# Patient Record
Sex: Male | Born: 1995 | Race: White | Hispanic: No | Marital: Single | Smoking: Never smoker
Health system: Southern US, Community
[De-identification: ages and names within clinical notes are randomized; demographics above are authoritative.]

---
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
## 2017-10-23 DIAGNOSIS — S93401A Sprain of unspecified ligament of right ankle, initial encounter: Secondary | ICD-10-CM | POA: Diagnosis not present

## 2017-11-04 DIAGNOSIS — H5213 Myopia, bilateral: Secondary | ICD-10-CM | POA: Diagnosis not present

## 2018-01-30 ENCOUNTER — Encounter: Payer: Self-pay | Admitting: Family Medicine

## 2018-01-30 ENCOUNTER — Ambulatory Visit (INDEPENDENT_AMBULATORY_CARE_PROVIDER_SITE_OTHER): Payer: 59 | Admitting: Family Medicine

## 2018-01-30 VITALS — BP 109/54 | HR 69 | Temp 98.5°F | Resp 16 | Ht 68.75 in | Wt 155.8 lb

## 2018-01-30 DIAGNOSIS — Z7689 Persons encountering health services in other specified circumstances: Secondary | ICD-10-CM | POA: Diagnosis not present

## 2018-01-30 DIAGNOSIS — J3089 Other allergic rhinitis: Secondary | ICD-10-CM | POA: Diagnosis not present

## 2018-01-30 DIAGNOSIS — Z23 Encounter for immunization: Secondary | ICD-10-CM | POA: Diagnosis not present

## 2018-01-30 NOTE — Progress Notes (Signed)
Subjective:    Patient ID: Benjamin Roberts, male    DOB: 04/17/1996, 22 y.o.   MRN: [id number]  Benjamin Roberts is a 22 y.o. male presenting on 01/30/2018 for Establish Care and Allergies (seasonal, environmental)  History primarily provided patient and also his mother, Gunnar Fusi, in attendance at visit today. He is establishing care here with new PCP. Previously followed by [facility].  HPI   Establish Care / General Health - Reports no significant long term health problems. 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 He does not use nose sprays or other treatment.   Confidentiality was discussed with the patient and if applicable, with caregiver as well.  Home: No concerns. Education: Doing well, at [employer] Activities: [employer]. He is physically active with training exercises. Drugs/Alcohol: Denies using. Sex: Admits sexually active. He uses condoms. Stress / Mood: Denies any concerns at this time.   Health Maintenance:  Vaccines - reviewed [facility] immunization record. All vaccines due and recommended were discussed with both patient and his father today.   He is due for the following vaccines at this time:   Men-B: He is due for this vaccine - patient and parent interested in Men-B (Bexsero) vaccination, we have discussed the benefits of vaccines, risks of disease  process it is aiming to prevent, and potential risks of vaccination. Due today for 1st dose - will receive. Will return for 2nd dose of series in 2 months.  UTD Tdap - from 07/24/2009 - next due after 10 years from last - due in 07/2019 - deferred today  Depression screen PHQ 2/9 01/30/2018  Decreased Interest 0  Down, Depressed, Hopeless 0  PHQ - 2 Score 0    History reviewed. No pertinent past medical history. Past Surgical History:  Procedure Laterality Date  . DENTAL SURGERY  2013   Wisdom teeth extraction, canine surgery (retained teeth)   Social History   Socioeconomic History  . Marital status: Single    Spouse name: Not on file  . Number of children: Not on file  . Years of education: Not on file  . Highest education level: Not on file  Occupational History  . Not on file  Social Needs  . Financial resource strain: Not on file  . Food insecurity:    Worry: Not on file    Inability: Not on file  . Transportation needs:    Medical: Not on file    Non-medical: Not on file  Tobacco Use  . Smoking status: Never Smoker  . Smokeless tobacco: Never Used  Substance and Sexual Activity  . Alcohol use: Never    Frequency: Never  . Drug use: Never  . Sexual activity: Not on file  Lifestyle  . Physical activity:    Days per week: Not on file    Minutes per session: Not on file  .  Stress: Not on file  Relationships  . Social connections:    Talks on phone: Not on file    Gets together: Not on file    Attends religious service: Not on file    Active member of club or organization: Not on file    Attends meetings of clubs or organizations: Not on file    Relationship status: Not on file  . Intimate partner violence:    Fear of current or ex partner: Not on file    Emotionally abused: Not on file    Physically abused: Not on file    Forced sexual activity: Not on file  Other Topics Concern  . Not on file  Social History Narrative  . Not on file   Family History    Problem Relation Age of Onset  . Cancer Mother        skin  . OCD Mother   . Insomnia Mother   . Diabetes Father   . Hypertension Father   . Hypertension Maternal Grandmother   . Hyperlipidemia Maternal Grandmother   . Hypertension Maternal Grandfather   . Depression Maternal Grandfather   . Cancer Paternal Grandfather        colon, bladder  . Hypertension Paternal Grandfather   . Hyperlipidemia Paternal Grandfather   . Heart disease Paternal Grandfather   . Food Allergy Other   . Other Other        POTS   No current outpatient medications on file prior to visit.   No current facility-administered medications on file prior to visit.     Review of Systems Per HPI unless specifically indicated above     Objective:    BP (!) 109/54   Pulse 69   Temp 98.5 F (36.9 C) (Oral)   Resp 16   Ht 5' 8.75" (1.746 m)   Wt 155 lb 12.8 oz (70.7 kg)   BMI 23.18 kg/m   Wt Readings from Last 3 Encounters:  01/30/18 155 lb 12.8 oz (70.7 kg) (51 %, Z= 0.03)*   * Growth percentiles are based on CDC (Boys, 2-20 Years) data.    Physical Exam  Constitutional: He is oriented to person, place, and time. He appears well-developed and well-nourished. No distress.  Well-appearing, comfortable, cooperative, thin athletic  HENT:  Head: Normocephalic and atraumatic.  Mouth/Throat: Oropharynx is clear and moist.  Nares patent without purulence or edema. Bilateral TMs clear without erythema, effusion or bulging, minimal soft cerumen not impacted. Oropharynx clear without erythema, exudates, edema or asymmetry.  Eyes: Conjunctivae are normal. Right eye exhibits no discharge. Left eye exhibits no discharge.  Neck: Normal range of motion. Neck supple. No thyromegaly present.  Cardiovascular: Normal rate, regular rhythm, normal heart sounds and intact distal pulses.  No murmur heard. Pulmonary/Chest: Effort normal and breath sounds normal. No respiratory distress. He has no wheezes. He has no  rales.  Musculoskeletal: Normal range of motion. He exhibits no edema.  Back normal without deformity or abnormal curvature.  Upper / Lower Extremities: - Normal muscle tone, strength bilateral upper extremities 5/5, lower extremities 5/5 - Bilateral shoulders, knees, wrist, ankles without deformity, tenderness, effusion - Normal Gait  Lymphadenopathy:    He has no cervical adenopathy.  Neurological: He is alert and oriented to person, place, and time.  Skin: Skin is warm and dry. No rash noted. He is not diaphoretic. No erythema.  Psychiatric: He has a normal mood and affect. His behavior is normal.  Well groomed, good eye contact,  normal speech and thoughts  Nursing note and vitals reviewed.  No results found for this or any previous visit.    Assessment & Plan:   Problem List Items Addressed This Visit    Environmental and seasonal allergies - Primary Stable without flare Not taking med currently Worse seasons Spring/Summer/Fall Advised to take 2nd gen anti-histamine prior to onset allergy season preventatively May use Nasal Saline preventatively as well - if avoid meds Follow-up     Other Visit Diagnoses    Encounter to establish care with new doctor     [facility]    Need for meningococcal vaccination     See about HPI regarding vaccines - Counseling provided to patient and parent - Avg risk patient - Highly recommended vaccine, already had meningococcal vaccine ACY  1st dose Men-B today - return 2 months for 2nd dose and Flu vaccine    Relevant Orders   Meningococcal B, OMV (Bexsero) (Completed)      No orders of the defined types were placed in this encounter.   Follow up plan: Return in about 2 months (around 04/01/2018) for 2nd dose men-B vaccine and Flu shot - then yearly check-up.  Saralyn PilarAlexander Tashema Tiller, DO [facility] 01/30/2018, 9:52 PM

## 2018-01-30 NOTE — Patient Instructions (Addendum)
Thank you for coming to the office today.  Overall I think you are in good health. Keep up the good work.  Stay active as you are to keep leading healthy lifestyle.  For allergies - I do recommend trying to use claritin or zyrtec about 1 month BEFORE seasonal allergies start to help reduce symptoms.  - Recommend to start using Nasal Saline (Simply Saline - OTC) spray multiple times a day to help flush out congestion and clear sinuses - this can reduce allergen exposure  You are due for Meningococcal B Vaccine (alternate strain, rare but very serious type of meningitis) - Bexsero vaccine - one dose today - 2nd final dose in 2 months  May get Flu Shot when you return at that time as well.  You are not quite due for TDap tetnaus vaccine at this time, - last was 07/2009 - next due in 07/2019  Follow-up as needed - keep doing yearly physical through [employer] and I would recommend a yearly check up here to make sure doing well.  Please schedule a Follow-up Appointment to: Return in about 2 months (around 04/01/2018) for 2nd dose men-B vaccine and Flu shot - then yearly check-up.  If you have any other questions or concerns, please feel free to call the office or send a message through MyChart. You may also schedule an earlier appointment if necessary.  Additionally, you may be receiving a survey about your experience at our office within a few days to 1 week by e-mail or mail. We value your feedback.  Saralyn PilarAlexander Latriece Anstine, DO [facility]

## 2018-02-18 ENCOUNTER — Ambulatory Visit
Admission: EM | Admit: 2018-02-18 | Discharge: 2018-02-18 | Disposition: A | Discharge status: Home / Self Care | Payer: 59 | Attending: Family Medicine | Admitting: Family Medicine

## 2018-02-18 ENCOUNTER — Other Ambulatory Visit: Payer: Self-pay

## 2018-02-18 ENCOUNTER — Ambulatory Visit (INDEPENDENT_AMBULATORY_CARE_PROVIDER_SITE_OTHER): Payer: 59

## 2018-02-18 ENCOUNTER — Encounter: Payer: Self-pay | Admitting: Gynecology

## 2018-02-18 DIAGNOSIS — S90211A Contusion of right great toe with damage to nail, initial encounter: Secondary | ICD-10-CM | POA: Diagnosis not present

## 2018-02-18 DIAGNOSIS — S99921A Unspecified injury of right foot, initial encounter: Secondary | ICD-10-CM

## 2018-02-18 DIAGNOSIS — S90221A Contusion of right lesser toe(s) with damage to nail, initial encounter: Secondary | ICD-10-CM

## 2018-02-18 DIAGNOSIS — Z23 Encounter for immunization: Secondary | ICD-10-CM

## 2018-02-18 MED ORDER — TETANUS-DIPHTH-ACELL PERTUSSIS 5-2.5-18.5 LF-MCG/0.5 IM SUSP
0.5000 mL | Freq: Once | INTRAMUSCULAR | Status: AC
  Administered 2018-02-18: 0.5 mL via INTRAMUSCULAR

## 2018-02-18 MED ORDER — MUPIROCIN 2 % EX OINT: TOPICAL_OINTMENT | CUTANEOUS | 0 refills | Status: AC

## 2018-02-18 MED ORDER — CEPHALEXIN 500 MG PO CAPS: 500.0000 mg | ORAL_CAPSULE | Freq: Three times a day (TID) | ORAL | 0 refills | Status: AC

## 2018-02-18 NOTE — ED Triage Notes (Signed)
Patient c/o right big toe injury x yesterday.

## 2018-02-18 NOTE — Discharge Instructions (Addendum)
Take medication as prescribed. Rest. Drink plenty of fluids. Keep clean. Warm soapy water soaks.  Follow up with your primary care physician this week as needed. Return to Urgent care for new or worsening concerns.

## 2018-02-18 NOTE — ED Provider Notes (Signed)
[facility] ____________________________________________  Time seen: Approximately 1:25 PM  I have reviewed the triage vital signs and the nursing notes.   HISTORY  Chief Complaint Toe Injury  HPI Benjamin Roberts is a 22 y.o. male presenting with family bedside for evaluation of right great toe pain [place].  Reports he was swinging on a rope swing, and toe accidentally hit against the ground and pushed toenail up some.  Reports did continue to swim afterwards.  Reports tetanus immunization last was 9 years ago.  States toe is very tender to touch predominantly around the toenail.  Denies any other pain or injuries.  Did clean the area with vodka at time of injury.  Has since cleaned with soap and water.  Denies other aggravating alleviating factors.  Reports otherwise feels well. Denies recent sickness. Denies recent antibiotic use.    History reviewed. No pertinent past medical history.  Patient Active Problem List   Diagnosis Date Noted  . Environmental and seasonal allergies 01/30/2018    Past Surgical History:  Procedure Laterality Date  . DENTAL SURGERY  2013   Wisdom teeth extraction, canine surgery (retained teeth)     No current facility-administered medications for this encounter.   Current Outpatient Medications:  .  cephALEXin (KEFLEX) 500 MG capsule, Take 1 capsule (500 mg total) by mouth 3 (three) times daily for 10 days., Disp: 30 capsule, Rfl: 0 .  mupirocin ointment (BACTROBAN) 2 %, Apply two times a day for 10 days., Disp: 22 g, Rfl: 0  Allergies Patient has no known allergies.  Family History  Problem Relation Age of Onset  . Cancer Mother        skin  . OCD Mother   . Insomnia Mother   . Diabetes Father   . Hypertension Father   . Hypertension Maternal Grandmother   . Hyperlipidemia Maternal Grandmother   . Hypertension Maternal Grandfather   . Depression Maternal Grandfather   . Cancer  Paternal Grandfather        colon, bladder  . Hypertension Paternal Grandfather   . Hyperlipidemia Paternal Grandfather   . Heart disease Paternal Grandfather   . Food Allergy Other   . Other Other        POTS    Social History Social History   Tobacco Use  . Smoking status: Never Smoker  . Smokeless tobacco: Never Used  Substance Use Topics  . Alcohol use: Never    Frequency: Never  . Drug use: Never    Review of Systems Constitutional: No fever/chills Cardiovascular: Denies chest pain. Respiratory: Denies shortness of breath. Gastrointestinal: No abdominal pain.  Musculoskeletal: Negative for back pain. Skin:as above.   ____________________________________________   PHYSICAL EXAM:  VITAL SIGNS: ED Triage Vitals  Enc Vitals Group     BP 02/18/18 1200 116/69     Pulse Rate 02/18/18 1200 76     Resp -- 16     Temp 02/18/18 1200 98.2 F (36.8 C)     Temp Source 02/18/18 1200 Oral     SpO2 02/18/18 1200 99 %     Weight 02/18/18 1201 155 lb (70.3 kg)     Height --      Head Circumference --      Peak Flow --      Pain Score 02/18/18 1200 8     Pain Loc --      Pain Edu? --      Excl. in [place]? --  Constitutional: Alert and oriented. Well appearing and in no acute distress. ENT      Head: Normocephalic and atraumatic. Cardiovascular: Normal rate, regular rhythm. Grossly normal heart sounds.  Good peripheral circulation. Respiratory: Normal respiratory effort without tachypnea nor retractions. Breath sounds are clear and equal bilaterally. No wheezes, rales, rhonchi. Musculoskeletal:  Steady gait. See skin below.   Neurologic:  Normal speech and language.Speech is normal. No gait instability.  Skin:  Skin is warm, dry. Except: Right great toe approximately distal one third of toenail mildly avulsed upwards with some dirt debris underneath toenail, base of toenail fully intact, subungual hematoma at base noted, mild to moderate tenderness immediately surrounding  toenail, right foot otherwise nontender, normal sensation and full range of motion present. Psychiatric: Mood and affect are normal. Speech and behavior are normal. Patient exhibits appropriate insight and judgment   ___________________________________________   LABS (all labs ordered are listed, but only abnormal results are displayed)  Labs Reviewed - No data to display ____________________________________________  RADIOLOGY  Dg Toe Great Right  Result Date: 02/18/2018 CLINICAL DATA:  Status post blunt trauma to the right great toe 02/17/2018. Initial encounter. EXAM: RIGHT GREAT TOE COMPARISON:  None. FINDINGS: There is no evidence of fracture or dislocation. There is no evidence of arthropathy or other focal bone abnormality. Soft tissues are unremarkable. IMPRESSION: Negative exam. Electronically Signed   By: Drusilla Kanner M.D.   On: 02/18/2018 12:30   ____________________________________________   PROCEDURES Procedures   Procedure explained and verbal consent obtained.  Toe soaked and cleaned with Betadine solution, sterile forceps utilized to remove dirt debris underneath toenail as well as sterile water forceful irrigation.  Cautery pen utilized to bore to proximal toenail with mild immediate bloody drainage.  Patient tolerated well.  INITIAL IMPRESSION / ASSESSMENT AND PLAN / ED COURSE  Pertinent labs & imaging results that were available during my care of the patient were reviewed by me and considered in my medical decision making (see chart for details).  Well-appearing patient.  No acute distress.  Mechanical injury to right great toe that occurred yesterday with centered beneath toenail.  Right great toe x-ray as above per radiologist and reviewed by myself, negative.  Toe cleaned and debris removed.  Subungual hematoma drained.  Tetanus immunization updated.  Will place patient on oral Keflex and topical Bactroban.  Supportive care and discuss strict strict follow-up and  return parameters.  Discussed follow up with Primary care physician this week. Discussed follow up and return parameters including no resolution or any worsening concerns. Patient verbalized understanding and agreed to plan.   ____________________________________________   FINAL CLINICAL IMPRESSION(S) / ED DIAGNOSES  Final diagnoses:  Injury of right great toe, initial encounter  Subungual hematoma of toe of right foot, initial encounter     ED Discharge Orders         Ordered    cephALEXin (KEFLEX) 500 MG capsule  3 times daily     02/18/18 1259    mupirocin ointment (BACTROBAN) 2 %     02/18/18 1259           Note: This dictation was prepared with Dragon dictation along with smaller phrase technology. Any transcriptional errors that result from this process are unintentional.         Renford Dills, NP 02/18/18 1343

## 2018-04-17 ENCOUNTER — Telehealth: Payer: 59 | Admitting: Physician Assistant

## 2018-04-17 DIAGNOSIS — J329 Chronic sinusitis, unspecified: Secondary | ICD-10-CM | POA: Diagnosis not present

## 2018-04-17 DIAGNOSIS — B9689 Other specified bacterial agents as the cause of diseases classified elsewhere: Secondary | ICD-10-CM

## 2018-04-17 MED ORDER — DOXYCYCLINE HYCLATE 100 MG PO CAPS: 100.0000 mg | ORAL_CAPSULE | Freq: Two times a day (BID) | ORAL | 0 refills | Status: AC

## 2018-04-17 NOTE — Progress Notes (Signed)

## 2018-05-15 ENCOUNTER — Ambulatory Visit (INDEPENDENT_AMBULATORY_CARE_PROVIDER_SITE_OTHER): Payer: 59

## 2018-05-15 DIAGNOSIS — Z23 Encounter for immunization: Secondary | ICD-10-CM | POA: Diagnosis not present

## 2019-03-06 ENCOUNTER — Ambulatory Visit
Admission: EM | Admit: 2019-03-06 | Discharge: 2019-03-06 | Disposition: A | Discharge status: Home / Self Care | Payer: No Typology Code available for payment source | Attending: Family Medicine | Admitting: Family Medicine

## 2019-03-06 ENCOUNTER — Encounter: Payer: Self-pay | Admitting: Emergency Medicine

## 2019-03-06 ENCOUNTER — Other Ambulatory Visit: Payer: Self-pay

## 2019-03-06 DIAGNOSIS — H9202 Otalgia, left ear: Secondary | ICD-10-CM

## 2019-03-06 NOTE — Discharge Instructions (Signed)
Ears were clear.  Tylenol, flonase, zyrtec.  Take care  Dr. Lacinda Axon

## 2019-03-06 NOTE — ED Triage Notes (Signed)
Patient c/o left ear pain that started 1 week ago.

## 2019-03-06 NOTE — ED Provider Notes (Addendum)
[facility]    CSN: [id number] Arrival date & time: 03/06/19  1658      History   Chief Complaint Chief Complaint  Patient presents with  . Otalgia   HPI  23 year old male presents with left ear pain.  Patient reports a one-week history of ear pain.  Was thought to be secondary to wax buildup.  His mother cleaned out his ears with success.  Continues to have ongoing ear pain.  Mild currently.  He took Tylenol today with improvement.  No discharge from the ear.  No respiratory symptoms.  No other associated symptoms.  No known exacerbating factors.  No other complaints.  PMH, Surgical Hx, Family Hx, Social History reviewed and updated as below.  PMH: Acne, Myopia  Patient Active Problem List   Diagnosis Date Noted  . Environmental and seasonal allergies 01/30/2018   Past Surgical History:  Procedure Laterality Date  . DENTAL SURGERY  2013   Wisdom teeth extraction, canine surgery (retained teeth)   Home Medications    Prior to Admission medications   Medication Sig Start Date End Date Taking? Authorizing Provider  cetirizine (ZYRTEC) 10 MG tablet Take 10 mg by mouth daily.   Yes [provider]  fluticasone (FLONASE) 50 MCG/ACT nasal spray Place into both nostrils daily.   Yes [provider]  mupirocin ointment (BACTROBAN) 2 % Apply two times a day for 10 days. 02/18/18   Marylene Land, NP    Family History Family History  Problem Relation Age of Onset  . Cancer Mother        skin  . OCD Mother   . Insomnia Mother   . Diabetes Father   . Hypertension Father   . Hypertension Maternal Grandmother   . Hyperlipidemia Maternal Grandmother   . Hypertension Maternal Grandfather   . Depression Maternal Grandfather   . Cancer Paternal Grandfather        colon, bladder  . Hypertension Paternal Grandfather   . Hyperlipidemia Paternal Grandfather   . Heart disease Paternal Grandfather   . Food Allergy Other   . Other Other        POTS     Social History Social History   Tobacco Use  . Smoking status: Never Smoker  . Smokeless tobacco: Never Used  Substance Use Topics  . Alcohol use: Never    Frequency: Never  . Drug use: Never     Allergies   Patient has no known allergies.   Review of Systems Review of Systems  Constitutional: Negative.   HENT: Positive for ear pain.    Physical Exam Triage Vital Signs ED Triage Vitals  Enc Vitals Group     BP --      Pulse --      Resp --      Temp --      Temp src --      SpO2 --      Weight 03/06/19 1709 160 lb (72.6 kg)     Height 03/06/19 1709 5' 10.5" (1.791 m)     Head Circumference --      Peak Flow --      Pain Score 03/06/19 1708 6     Pain Loc --      Pain Edu? --      Excl. in [place]? --    Updated Vital Signs BP 121/80 (BP Location: Right Arm)   Pulse 80   Temp 98.7 F (37.1 C)   Resp 18  Ht 5' 10.5" (1.791 m)   Wt 72.6 kg   SpO2 100%   BMI 22.63 kg/m   Visual Acuity Right Eye Distance:   Left Eye Distance:   Bilateral Distance:    Right Eye Near:   Left Eye Near:    Bilateral Near:     Physical Exam Vitals signs and nursing note reviewed.  Constitutional:      General: He is not in acute distress.    Appearance: Normal appearance. He is not ill-appearing.  HENT:     Head: Normocephalic and atraumatic.     Right Ear: Tympanic membrane normal. There is no impacted cerumen.     Left Ear: Tympanic membrane normal. There is no impacted cerumen.  Eyes:     General:        Right eye: No discharge.        Left eye: No discharge.     Conjunctiva/sclera: Conjunctivae normal.  Cardiovascular:     Rate and Rhythm: Normal rate and regular rhythm.     Heart sounds: No murmur.  Pulmonary:     Effort: Pulmonary effort is normal.     Breath sounds: Normal breath sounds. No wheezing, rhonchi or rales.  Neurological:     Mental Status: He is alert.  Psychiatric:        Mood and Affect: Mood normal.        Behavior: Behavior normal.     UC Treatments / Results  Labs (all labs ordered are listed, but only abnormal results are displayed) Labs Reviewed - No data to display  EKG   Radiology No results found.  Procedures Procedures (including critical care time)  Medications Ordered in UC Medications - No data to display  Initial Impression / Assessment and Plan / UC Course  I have reviewed the triage vital signs and the nursing notes.  Pertinent labs & imaging results that were available during my care of the patient were reviewed by me and considered in my medical decision making (see chart for details).    23 year old male presents with ear pain.  Exam normal.  Tylenol, Flonase, Antihistamine.  Supportive care.  Final Clinical Impressions(s) / UC Diagnoses   Final diagnoses:  Left ear pain     Discharge Instructions     Ears were clear.  Tylenol, flonase, zyrtec.  Take care  Dr. Adriana Simasook     ED Prescriptions    None     Controlled Substance Prescriptions  Controlled Substance Registry consulted? Not Applicable    Tommie SamsCook, Satonya Lux G, DO 03/06/19 1747

## 2019-03-14 ENCOUNTER — Other Ambulatory Visit: Payer: Self-pay

## 2019-03-14 ENCOUNTER — Ambulatory Visit (INDEPENDENT_AMBULATORY_CARE_PROVIDER_SITE_OTHER): Payer: No Typology Code available for payment source

## 2019-03-14 DIAGNOSIS — Z23 Encounter for immunization: Secondary | ICD-10-CM

## 2019-07-04 ENCOUNTER — Ambulatory Visit: Payer: No Typology Code available for payment source | Attending: Internal Medicine

## 2019-07-04 DIAGNOSIS — Z20822 Contact with and (suspected) exposure to covid-19: Secondary | ICD-10-CM

## 2019-07-05 LAB — NOVEL CORONAVIRUS, NAA: SARS-CoV-2, NAA: NOT DETECTED

## 2019-07-10 ENCOUNTER — Other Ambulatory Visit: Payer: Self-pay

## 2019-07-10 ENCOUNTER — Other Ambulatory Visit (HOSPITAL_COMMUNITY)
Admission: RE | Admit: 2019-07-10 | Discharge: 2019-07-10 | Disposition: A | Discharge status: Home / Self Care | Payer: No Typology Code available for payment source | Source: Ambulatory Visit | Attending: Family Medicine | Admitting: Family Medicine

## 2019-07-10 ENCOUNTER — Ambulatory Visit (INDEPENDENT_AMBULATORY_CARE_PROVIDER_SITE_OTHER): Payer: No Typology Code available for payment source | Admitting: Family Medicine

## 2019-07-10 ENCOUNTER — Encounter: Payer: Self-pay | Admitting: Family Medicine

## 2019-07-10 VITALS — BP 133/72 | HR 98 | Temp 98.7°F | Wt 175.0 lb

## 2019-07-10 DIAGNOSIS — Z202 Contact with and (suspected) exposure to infections with a predominantly sexual mode of transmission: Secondary | ICD-10-CM | POA: Diagnosis present

## 2019-07-10 DIAGNOSIS — N50811 Right testicular pain: Secondary | ICD-10-CM | POA: Diagnosis not present

## 2019-07-10 MED ORDER — AZITHROMYCIN 500 MG PO TABS: 1000.0000 mg | ORAL_TABLET | Freq: Once | ORAL | 0 refills | Status: AC

## 2019-07-10 NOTE — Patient Instructions (Addendum)
Thank you for coming to the office today.  Start Azithromycin 1000mg  (x 2 of the 500) today, if vomit pills back up, or other concern, call or message and we can switch to Doxycycline  IF we have to use it - then can switch to start taking Doxycycline antibiotic 100mg  twice daily for 7 days. Take with full glass of water and stay upright for at least 30 min after taking, may be seated or standing, but should NOT lay down. This is just a safety precaution, if this medicine does not go all the way down throat well it could cause some burning discomfort to throat and esophagus.  Absintence from intercourse for 14 days.  Likely no re-test required, notify us if symptoms do not resolve or change or worsen.  Please schedule a Follow-up Appointment to: Return if symptoms worsen or fail to improve, for STD.  If you have any other questions or concerns, please feel free to call the office or send a message through MyChart. You may also schedule an earlier appointment if necessary.  Additionally, you may be receiving a survey about your experience at our office within a few days to 1 week by e-mail or mail. We value your feedback.  , DO [facility], [facility]    Chlamydia, Male  Chlamydia is an STD (sexually transmitted disease). It is a bacterial infection that spreads through sexual contact (is contagious). Chlamydia can occur in different areas of the body, including the tube that moves urine from the bladder out of the body (urethra), the throat, or the rectum. This condition is not difficult to treat. However, if left untreated, chlamydia can lead to more serious health problems. What are the causes? Chlamydia is caused by the bacteria Chlamydia trachomatis. It is passed from an infected partner during sexual activity. Chlamydia can spread through contact with the genitals, mouth, or rectum. What are the signs or symptoms? In some cases, there may not be any  symptoms for this condition (asymptomatic), especially early in the infection. If symptoms develop, they may include:  Burning when urinating.  Urinating frequently.  Pain or swelling in the testicles.  Watery, mucus-like discharge from the penis.  Redness, soreness, and swelling (inflammation) of the rectum.  Bleeding or discharge from the rectum.  Abdominal pain.  Itching, burning, or redness in the eyes, or discharge from the eyes. How is this diagnosed? This condition may be diagnosed based on:  Urine tests.  Swab tests. Depending on your symptoms, your health care provider may use a cotton swab to collect discharge from your urethra or rectum to test for the bacteria. How is this treated? This condition is treated with antibiotic medicines. Follow these instructions at home: Medicines  Take over-the-counter and prescription medicines only as told by your health care provider.  Take your antibiotic medicine as told by your health care provider. Do not stop taking the antibiotic even if you start to feel better. Sexual activity  Tell sexual partners about your infection. This includes any oral, anal, or vaginal sex partners you have had within 60 days of when your symptoms started. Sexual partners should also be treated, even if they have no signs of the disease.  Do not have sex until you and your sexual partners have completed treatment and your health care provider says it is okay. If your health care provider prescribed you a single dose treatment, wait 7 days after taking the treatment before having sex. General instructions  It is your responsibility to get your test results. Ask your health care provider, or the department performing the test, when your results will be ready.  Get plenty of rest.  Eat a healthy, well-balanced diet.  Drink enough fluids to keep your urine clear or pale yellow.  Keep all follow-up visits as told by your health care provider. This  is important. You may need to be tested for infection again 3 months after treatment. How is this prevented? The only sure way to prevent chlamydia is to avoid sexual intercourse. However, you can lower your risk by:  Using latex condoms correctly every time you have sexual intercourse.  Not having multiple sexual partners.  Asking if your sexual partner has been tested for STIs and had negative results. Contact a health care provider if:  You develop new symptoms or your symptoms do not get better after completing treatment.  You have a fever or chills.  You have pain during sexual intercourse.  You develop new joint pain or swelling near your joints.  You have pain or soreness in your testicles. Get help right away if:  Your pain gets worse and does not get better with medicine.  You have abnormal discharge.  You develop flu-like symptoms, such as night sweats, sore throat, or muscle aches. Summary  Chlamydia is an STD (sexually transmitted disease). It is a bacterial infection that spreads (is contagious) through sexual contact.  This condition is not difficult to treat, however, if left untreated, it can lead to more serious health problems.  In some cases, there may not be any symptoms for this condition (asymptomatic).  This condition is treated with antibiotic medicines.  Using latex condoms correctly every time you have sexual intercourse can help prevent chlamydia. This information is not intended to replace advice given to you by your health care provider. Make sure you discuss any questions you have with your health care provider. Document Revised: 06/21/2017 Document Reviewed: 05/23/2016 Elsevier Patient Education  [place].

## 2019-07-10 NOTE — Progress Notes (Signed)
Subjective:    Patient ID: Benjamin Roberts, male    DOB: Jul 18, 1995, 24 y.o.   MRN: [id number]  Benjamin Roberts is a 24 y.o. male presenting on 07/10/2019 for Exposure to STD (Right testicle pain after ejaculation, girlfriend just recently informed him that she tested positive chlamydia )   HPI   STD Exposure to Chlamydia Recent history, his girlfriend tested positive for chlamydia about 4-5 days ago on a routine pap smear, but she was asymptomatic. He has been exposed and is here for testing and treatment - He noticed new symptom in past few weeks with some testicular discomfort R testicle pain after ejaculation seems to be very consistent, and has not had this symptom or any other problem otherwise - No prior exposure or history of chlamydia before. He has no antibiotic allergies. - Denies dysuria, urinary frequency, hematuria, odor    Depression screen [facility] 2/9 07/10/2019 01/30/2018  Decreased Interest 0 0  Down, Depressed, Hopeless 0 0  PHQ - 2 Score 0 0    Social History   Tobacco Use  . Smoking status: Never Smoker  . Smokeless tobacco: Never Used  Substance Use Topics  . Alcohol use: Never  . Drug use: Never    Review of Systems Per HPI unless specifically indicated above     Objective:    BP 133/72   Pulse 98   Temp 98.7 F (37.1 C) (Oral)   Wt 175 lb (79.4 kg)   BMI 24.75 kg/m   Wt Readings from Last 3 Encounters:  07/10/19 175 lb (79.4 kg)  03/06/19 160 lb (72.6 kg)  02/18/18 155 lb (70.3 kg) (50 %, Z= -0.01)*   * Growth percentiles are based on CDC (Boys, 2-20 Years) data.    Physical Exam Vitals and nursing note reviewed.  Constitutional:      General: He is not in acute distress.    Appearance: He is well-developed. He is not diaphoretic.     Comments: Well-appearing, comfortable, cooperative  HENT:     Head: Normocephalic and atraumatic.  Eyes:     General:        Right eye: No discharge.        Left eye: No discharge.     Conjunctiva/sclera:  Conjunctivae normal.  Cardiovascular:     Rate and Rhythm: Normal rate.  Pulmonary:     Effort: Pulmonary effort is normal.  Skin:    General: Skin is warm and dry.     Findings: No erythema or rash.  Neurological:     Mental Status: He is alert and oriented to person, place, and time.  Psychiatric:        Behavior: Behavior normal.     Comments: Well groomed, good eye contact, normal speech and thoughts       Results for orders placed or performed in visit on 07/04/19  Novel Coronavirus, NAA ([facility])   Specimen: Oropharyngeal(OP) collection in vial transport medium   OROPHARYNGEA  TESTING  Result Value Ref Range   SARS-CoV-2, NAA Not Detected Not Detected      Assessment & Plan:   Problem List Items Addressed This Visit    None    Visit Diagnoses    Exposure to chlamydia    -  Primary   Relevant Medications   azithromycin (ZITHROMAX) 500 MG tablet   Other Relevant Orders   GC/Chlamydia probe amp ([place])not at [facility]   Right testicular pain         Known exposure  to chlamydia (girlfriend positive recently on pap smear test) Asymptomatic except some R testicular discomfort w/ ejaculation only No other complication or other abnormality, he declined genital exam today  No prior STD dx.  Plan: 1. Check urine cytology GC/Chlamydia today (note, not first void but has not voided in many hours) - If test positive will notify health dept 2. Offered additional STD testing blood test HIV RPR but he declined 3. Empiric treatment for Chlamydia ONLY today given he has had a known specific exposure - Azithromycin 1g PO x 1 dose rx sent 500mg  x 2 to his pharmacy 4. Counseled on no sexual intercourse for 14 days, advised regular condom use in future to limit STD 5. Return criteria given (especially advised to call if n/v up pills today, will need longer duration doxycycline alternate dosing therapy for chlamydia).   Meds ordered this encounter  Medications  . azithromycin  (ZITHROMAX) 500 MG tablet    Sig: Take 2 tablets (1,000 mg total) by mouth once for 1 dose. Take with food. If nausea/vomit, then notify doctor office for alternative rx.    Dispense:  2 tablet    Refill:  0      Follow up plan: Return if symptoms worsen or fail to improve, for STD.   , DO [facility] 07/10/2019, 2:00 PM

## 2019-07-12 LAB — GC/CHLAMYDIA PROBE AMP (~~LOC~~) NOT AT ARMC
Chlamydia: NEGATIVE
Comment: NEGATIVE
Comment: NORMAL
Neisseria Gonorrhea: NEGATIVE

## 2019-07-22 IMAGING — CR DG TOE GREAT 2+V*R*
4 series · 4 of 4 positions shown · non-contrast
Comparison: None.

CLINICAL DATA: Status post blunt trauma to the right great toe
02/17/2018. Initial encounter.

EXAM:
RIGHT GREAT TOE

[toe ap]
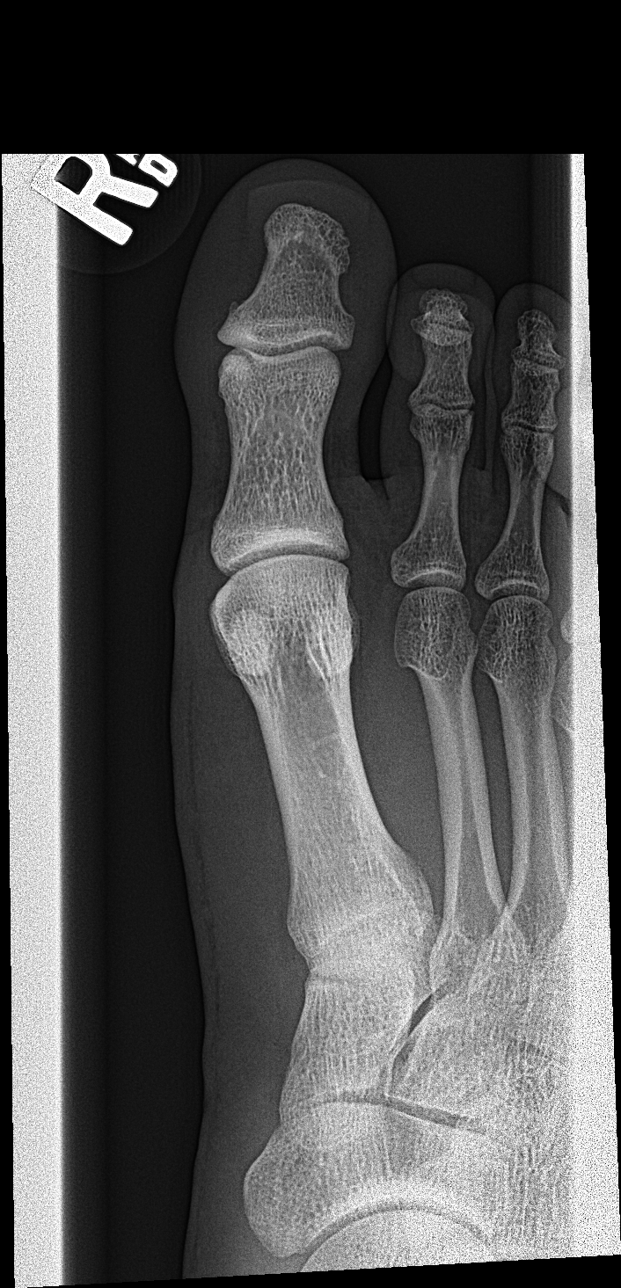

[toe obl]
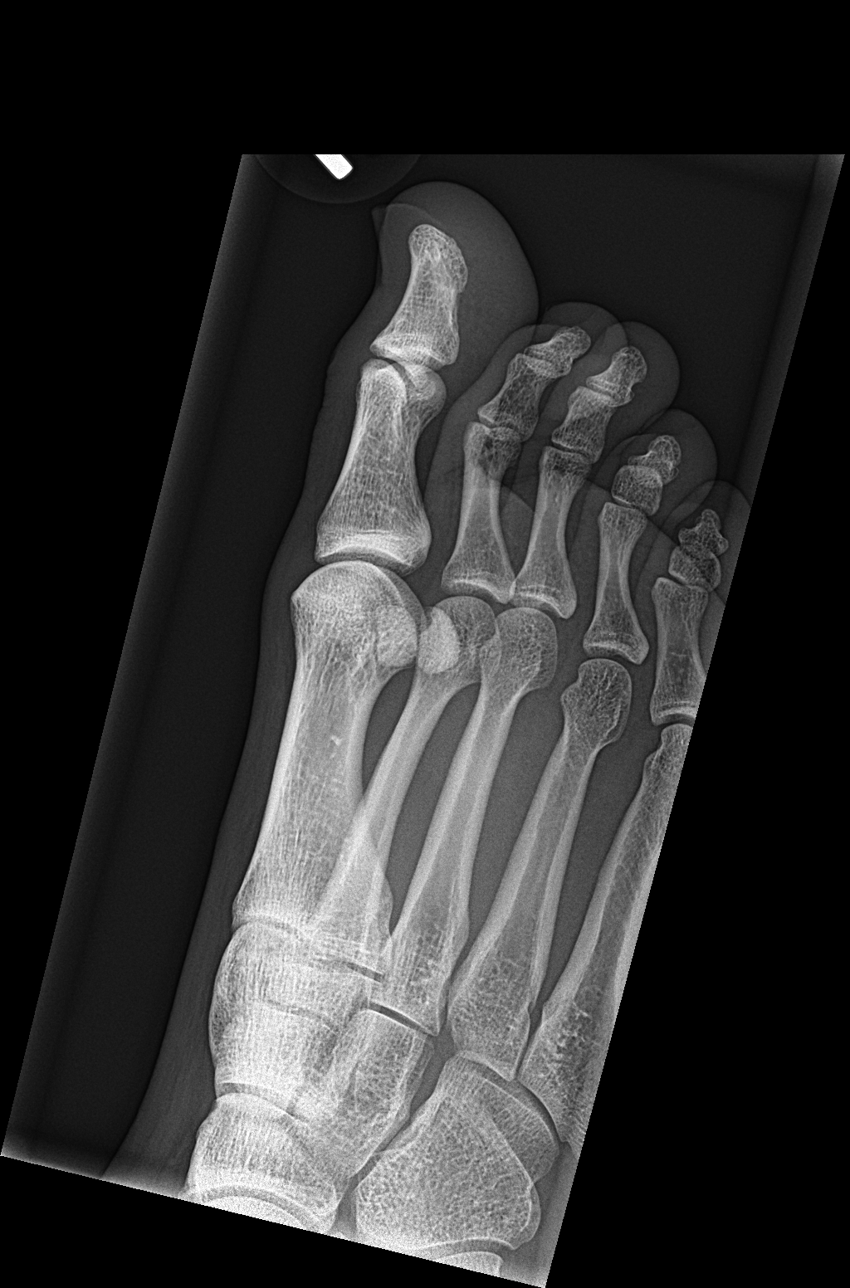

[toe lat (1 of 2)]
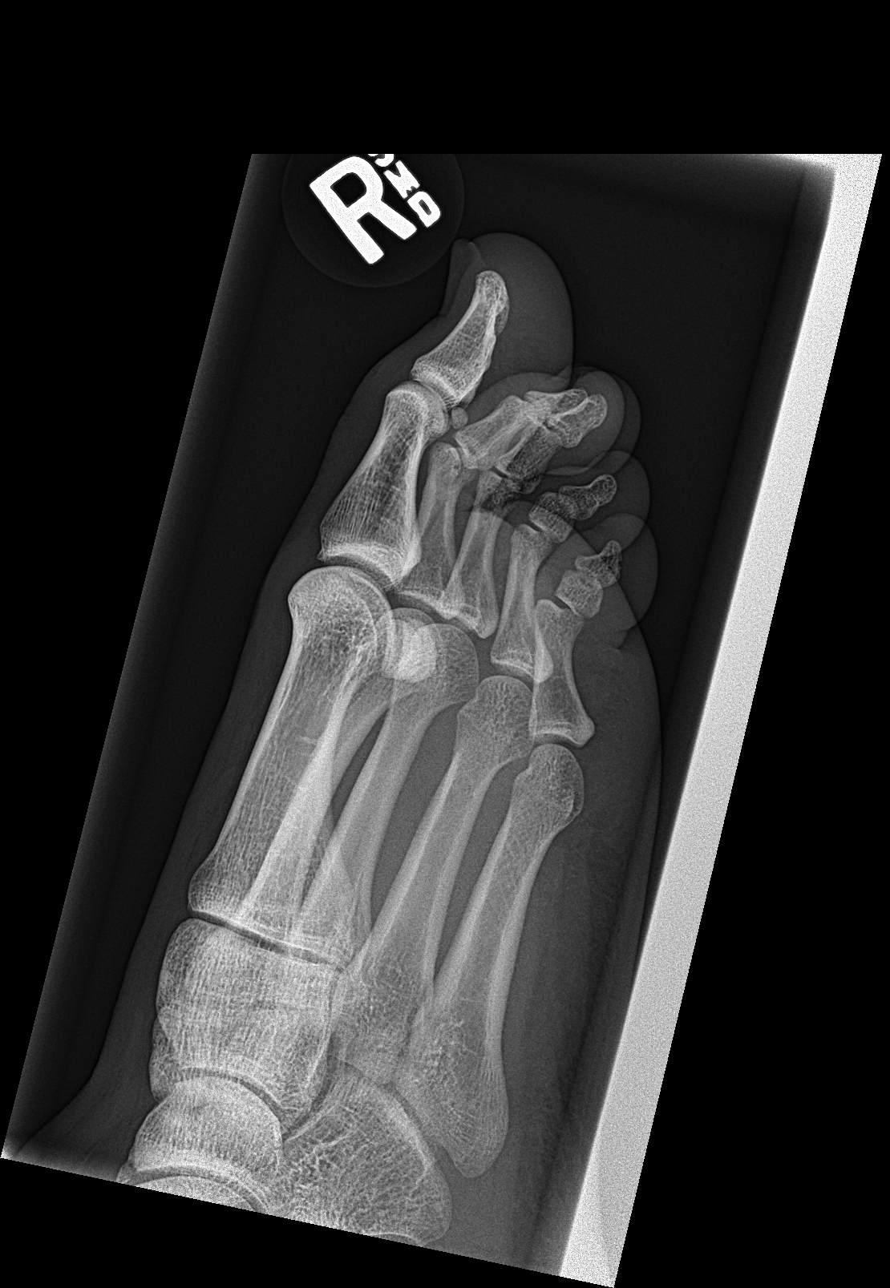

[toe lat (2 of 2)]
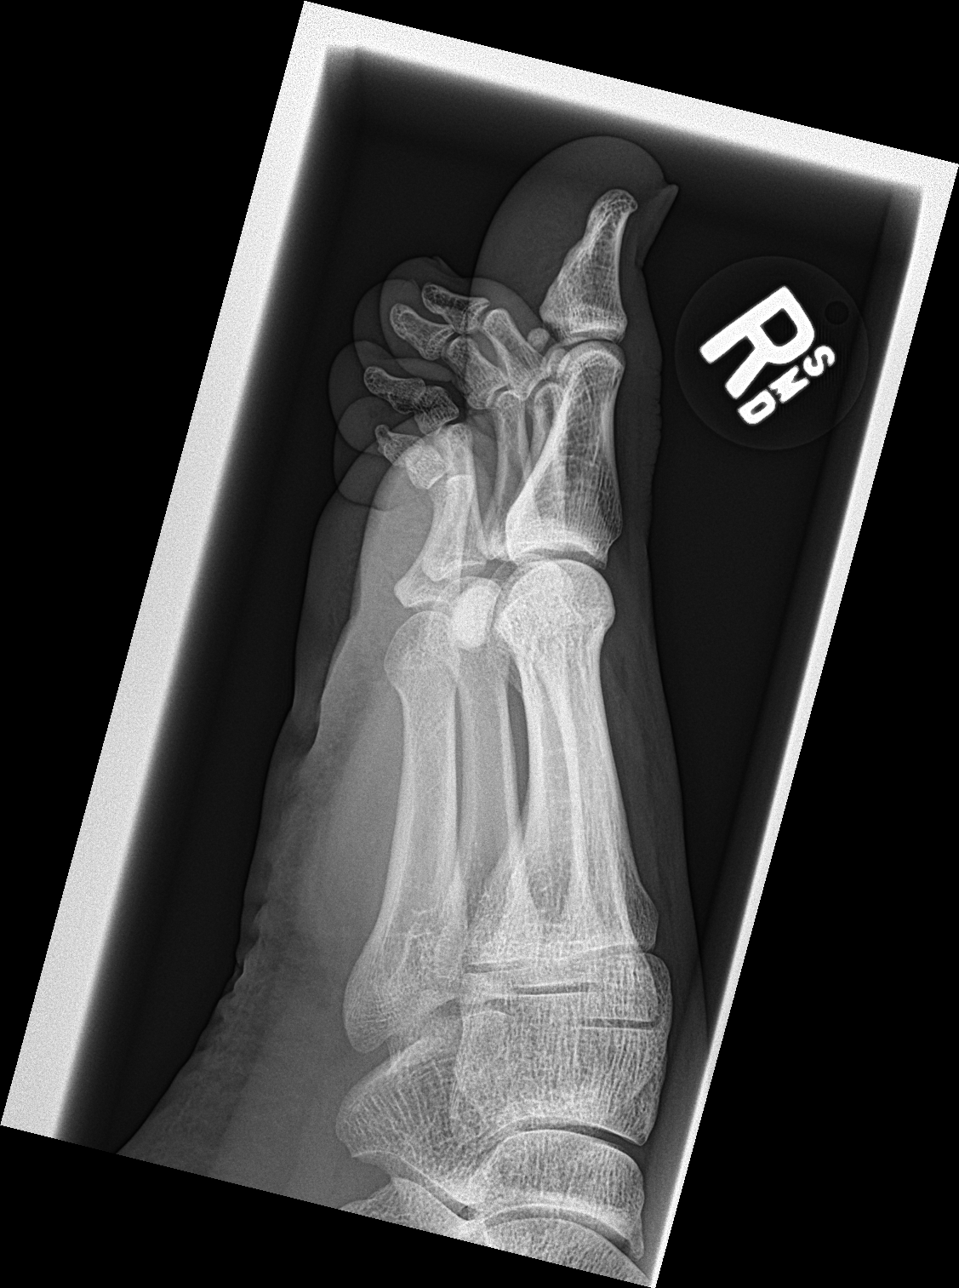

[4 of 4 positions shown; findings below may reference images not displayed]

FINDINGS: There is no evidence of fracture or dislocation. There is no
evidence of arthropathy or other focal bone abnormality. Soft
tissues are unremarkable.
IMPRESSION: Negative exam.

## 2019-08-07 ENCOUNTER — Ambulatory Visit: Payer: No Typology Code available for payment source | Attending: Internal Medicine

## 2019-08-07 DIAGNOSIS — Z20822 Contact with and (suspected) exposure to covid-19: Secondary | ICD-10-CM

## 2019-08-10 LAB — NOVEL CORONAVIRUS, NAA: SARS-CoV-2, NAA: NOT DETECTED

## 2019-09-26 ENCOUNTER — Telehealth: Payer: Self-pay | Admitting: Family Medicine

## 2019-09-26 NOTE — Telephone Encounter (Signed)
Patient experiencing nausea and vomiting. Now improved. Needs note for PT. Note printed.  Everlene Other DO [facility]

## 2019-12-30 ENCOUNTER — Telehealth: Payer: Self-pay | Admitting: Family Medicine

## 2019-12-30 NOTE — Telephone Encounter (Signed)
Copied from CRM [id number]. Topic: General - Other >> Dec 30, 2019  8:10 AM Gwenlyn Fudge wrote: Reason for CRM: Pts mother called and is requesting to have pt seen in the office for ear pain and sore throat. Pts mother was advised that she would not be able to have pt seen in the office with his symptoms and stated that she would like someone else to speak with her and give her a call back. Please advise.

## 2019-12-30 NOTE — Telephone Encounter (Signed)
Alright. Will see him anticipated 7/15.  Saralyn Pilar, DO [facility] 12/30/2019, 1:25 PM

## 2019-12-30 NOTE — Telephone Encounter (Signed)
Spoke to the patient's mother regarding his apt, she has no more question.

## 2020-01-02 ENCOUNTER — Ambulatory Visit (INDEPENDENT_AMBULATORY_CARE_PROVIDER_SITE_OTHER): Payer: No Typology Code available for payment source | Admitting: Family Medicine

## 2020-01-02 ENCOUNTER — Encounter: Payer: Self-pay | Admitting: Family Medicine

## 2020-01-02 ENCOUNTER — Other Ambulatory Visit: Payer: Self-pay

## 2020-01-02 VITALS — BP 114/65 | HR 77 | Temp 97.7°F | Resp 16 | Ht 70.0 in | Wt 152.0 lb

## 2020-01-02 DIAGNOSIS — J029 Acute pharyngitis, unspecified: Secondary | ICD-10-CM

## 2020-01-02 DIAGNOSIS — J0110 Acute frontal sinusitis, unspecified: Secondary | ICD-10-CM

## 2020-01-02 LAB — POCT RAPID STREP A (OFFICE): Rapid Strep A Screen: NEGATIVE

## 2020-01-02 MED ORDER — AMOXICILLIN-POT CLAVULANATE 875-125 MG PO TABS: 1.0000 | ORAL_TABLET | Freq: Two times a day (BID) | ORAL | 0 refills | Status: AC

## 2020-01-02 NOTE — Patient Instructions (Addendum)
Thank you for coming to the office today.  Rapid strep NEGATIVE today  No sign ear infection, some mild fluid behind ears, some wax in left ear but not blocking.  1. It sounds like you have a Sinusitis (Bacterial Infection) - this most likely started as an Upper Respiratory Virus that has settled into an infection. Allergies can also cause this. - Start Augmentin 1 pill twice daily (breakfast and dinner, with food and plenty of water) for 7 days, complete entire course, do not stop early even if feeling better - Resume Loratadine (Claritin) 10mg  daily and Flonase 2 sprays in each nostril daily for next 4-6 weeks, then you may stop and use seasonally or as needed - Recommend to keep using Nasal Saline spray multiple times a day to help flush out congestion and clear sinuses - Improve hydration by drinking plenty of clear fluids (water, gatorade) to reduce secretions and thin congestion - Congestion draining down throat can cause irritation. May try warm herbal tea with honey, cough drops - Can take Tylenol or Ibuprofen as needed for fevers - May continue over the counter cold medicine as you are, I would not use any decongestant or mucinex longer than 7 days.  If you develop persistent fever >101F for at least 3 consecutive days, headaches with sinus pain or pressure or persistent earache, please schedule a follow-up evaluation within next few days to week.   Please schedule a Follow-up Appointment to: Return in about 1 week (around 01/09/2020), or if symptoms worsen or fail to improve, for sinus.  If you have any other questions or concerns, please feel free to call the office or send a message through MyChart. You may also schedule an earlier appointment if necessary.  Additionally, you may be receiving a survey about your experience at our office within a few days to 1 week by e-mail or mail. We value your feedback.  01/11/2020, DO [facility], [facility]

## 2020-01-02 NOTE — Progress Notes (Signed)
Subjective:    Patient ID: Benjamin Roberts, male    DOB: February 07, 1996, 24 y.o.   MRN: [id number]  Benjamin Roberts is a 24 y.o. male presenting on 01/02/2020 for Ear Pain (Left side 2-3 weeks--could be sore throat-nasal congestion in past couple of weeks ago denies fever or chills or HA) and Sore Throat (onset week--was getting improved now as compare to 3-4 days but still hurts )   HPI   Left Ear Pain / Pressure Sinusitis Sore throat Reports onset 2-3 weeks first symptoms, had sinus allergy symptoms, was off allergy pill for week, outdoor environment, then developed L ear pain pressure sinus drainage and sore throat. No sick contact. No fever chills. Seems some sore throat improved but he wanted to get tested for strep. Still has sinus pain and pressure and drainage. Occasional cough - Denies nausea vomiting abdominal pain chest pain productive cough, dizziness, weakness, loss of taste or smell  Unvaccinated against COVID  Depression screen [facility] 2/9 07/10/2019 01/30/2018  Decreased Interest 0 0  Down, Depressed, Hopeless 0 0  PHQ - 2 Score 0 0    Social History   Tobacco Use  . Smoking status: Never Smoker  . Smokeless tobacco: Never Used  Substance Use Topics  . Alcohol use: Never  . Drug use: Never    Review of Systems Per HPI unless specifically indicated above     Objective:    BP 114/65   Pulse 77   Temp 97.7 F (36.5 C) (Temporal)   Resp 16   Ht 5\' 10"  (1.778 m)   Wt 152 lb (68.9 kg)   SpO2 100%   BMI 21.81 kg/m   Wt Readings from Last 3 Encounters:  01/02/20 152 lb (68.9 kg)  07/10/19 175 lb (79.4 kg)  03/06/19 160 lb (72.6 kg)    Physical Exam Vitals and nursing note reviewed.  Constitutional:      General: He is not in acute distress.    Appearance: He is well-developed. He is not diaphoretic.     Comments: Well-appearing, comfortable, cooperative  HENT:     Head: Normocephalic and atraumatic.     Right Ear: Ear canal normal. No drainage, swelling or  tenderness. A middle ear effusion is present. Tympanic membrane is not erythematous.     Left Ear: Ear canal normal. No drainage, swelling or tenderness. A middle ear effusion is present. Tympanic membrane is not erythematous.     Ears:     Comments: Left Partial ear wax L side soft cerumen not impacted     Nose: Congestion present.     Mouth/Throat:     Mouth: Mucous membranes are moist. No oral lesions.     Pharynx: Posterior oropharyngeal erythema (generalized posterior) present. No pharyngeal swelling or oropharyngeal exudate.     Tonsils: No tonsillar exudate or tonsillar abscesses.  Eyes:     General:        Right eye: No discharge.        Left eye: No discharge.     Conjunctiva/sclera: Conjunctivae normal.  Neck:     Thyroid: No thyromegaly.  Cardiovascular:     Rate and Rhythm: Normal rate and regular rhythm.     Heart sounds: Normal heart sounds. No murmur heard.   Pulmonary:     Effort: Pulmonary effort is normal. No respiratory distress.     Breath sounds: Normal breath sounds. No wheezing or rales.  Musculoskeletal:        General: Normal range of motion.  Cervical back: Normal range of motion and neck supple.  Lymphadenopathy:     Cervical: No cervical adenopathy.  Skin:    General: Skin is warm and dry.     Findings: No erythema or rash.  Neurological:     Mental Status: He is alert and oriented to person, place, and time.  Psychiatric:        Behavior: Behavior normal.     Comments: Well groomed, good eye contact, normal speech and thoughts       Results for orders placed or performed in visit on 01/02/20  POCT rapid strep A  Result Value Ref Range   Rapid Strep A Screen Negative Negative      Assessment & Plan:   Problem List Items Addressed This Visit    None    Visit Diagnoses    Acute non-recurrent frontal sinusitis    -  Primary   Relevant Medications   amoxicillin-clavulanate (AUGMENTIN) 875-125 MG tablet   Sore throat       Relevant  Orders   POCT rapid strep A (Completed)      Consistent with acute frontal sinusitis, likely initially viral URI vs allergic rhinitis component with worsening concern for bacterial infection 2-3 week duration - L side ear pain with some effusion both sides have effusion, some non impacted cerumen, no sign AOM. Rapid strep is negative  Less likely COVID19, precautions  And when to test info given  Plan: 1. Empiric - for sinusitis Start Augmentin 875-125mg  PO BID x 7 day 2. Resume Loratadine (Claritin) 10mg  daily and Flonase 2 sprays in each nostril daily for next 4-6 weeks, then may stop and use seasonally or as needed 3. Supportive care with nasal saline OTC, hydration 4. Return criteria reviewed   Meds ordered this encounter  Medications  . amoxicillin-clavulanate (AUGMENTIN) 875-125 MG tablet    Sig: Take 1 tablet by mouth 2 (two) times daily.    Dispense:  14 tablet    Refill:  0      Follow up plan: Return in about 1 week (around 01/09/2020), or if symptoms worsen or fail to improve, for sinus.   01/11/2020, DO [facility] 01/02/2020, 8:18 AM

## 2020-02-04 ENCOUNTER — Other Ambulatory Visit: Payer: Self-pay

## 2020-02-04 ENCOUNTER — Ambulatory Visit
Admission: RE | Admit: 2020-02-04 | Discharge: 2020-02-04 | Disposition: A | Discharge status: Home / Self Care | Payer: No Typology Code available for payment source | Source: Ambulatory Visit | Attending: Family Medicine | Admitting: Family Medicine

## 2020-02-04 VITALS — BP 116/72 | HR 88 | Temp 98.2°F | Resp 18

## 2020-02-04 DIAGNOSIS — Z20822 Contact with and (suspected) exposure to covid-19: Secondary | ICD-10-CM | POA: Diagnosis not present

## 2020-02-04 DIAGNOSIS — Z0189 Encounter for other specified special examinations: Secondary | ICD-10-CM

## 2020-02-04 LAB — SARS CORONAVIRUS 2 (TAT 6-24 HRS): SARS Coronavirus 2: NEGATIVE

## 2020-02-04 NOTE — ED Triage Notes (Signed)
Patient here for COVID testing.

## 2020-02-14 ENCOUNTER — Other Ambulatory Visit: Payer: Self-pay

## 2020-02-14 ENCOUNTER — Ambulatory Visit
Admission: RE | Admit: 2020-02-14 | Discharge: 2020-02-14 | Disposition: A | Discharge status: Home / Self Care | Payer: No Typology Code available for payment source | Source: Ambulatory Visit | Attending: Internal Medicine | Admitting: Internal Medicine

## 2020-02-14 VITALS — BP 122/74 | HR 74 | Temp 98.3°F | Resp 16 | Ht 70.0 in | Wt 160.0 lb

## 2020-02-14 DIAGNOSIS — Z20822 Contact with and (suspected) exposure to covid-19: Secondary | ICD-10-CM | POA: Insufficient documentation

## 2020-02-14 DIAGNOSIS — Z0189 Encounter for other specified special examinations: Secondary | ICD-10-CM | POA: Diagnosis not present

## 2020-02-14 DIAGNOSIS — Z1152 Encounter for screening for COVID-19: Secondary | ICD-10-CM

## 2020-02-14 NOTE — ED Triage Notes (Signed)
Patient is here for COVID test for school.  Patient denies COVID symptoms.

## 2020-02-14 NOTE — Discharge Instructions (Signed)

## 2020-02-15 LAB — SARS CORONAVIRUS 2 (TAT 6-24 HRS): SARS Coronavirus 2: NEGATIVE

## 2021-01-04 ENCOUNTER — Encounter: Payer: No Typology Code available for payment source | Admitting: Family Medicine
